# Patient Record
Sex: Male | Born: 1935 | Race: White | Hispanic: No | Marital: Married
Health system: Southern US, Community
[De-identification: ages and names within clinical notes are randomized; demographics above are authoritative.]

---

## 2005-03-12 ENCOUNTER — Encounter: Payer: Self-pay | Admitting: Rheumatology

## 2006-08-03 ENCOUNTER — Emergency Department: Payer: Self-pay | Admitting: Unknown Physician Specialty

## 2006-08-03 ENCOUNTER — Other Ambulatory Visit: Payer: Self-pay

## 2007-06-24 ENCOUNTER — Other Ambulatory Visit: Payer: Self-pay

## 2007-06-24 ENCOUNTER — Inpatient Hospital Stay: Payer: Self-pay | Admitting: Cardiology

## 2010-07-18 ENCOUNTER — Ambulatory Visit: Payer: Self-pay | Admitting: General Practice

## 2010-08-17 ENCOUNTER — Observation Stay: Payer: Self-pay | Admitting: Internal Medicine

## 2010-09-05 ENCOUNTER — Inpatient Hospital Stay (HOSPITAL_COMMUNITY)
Admission: RE | Admit: 2010-09-05 | Discharge: 2010-09-11 | Payer: Self-pay | Source: Home / Self Care | Admitting: Neurosurgery

## 2010-12-18 LAB — CBC
HCT: 23.8 % — ABNORMAL LOW (ref 39.0–52.0)
HCT: 33.9 % — ABNORMAL LOW (ref 39.0–52.0)
HCT: 34.3 % — ABNORMAL LOW (ref 39.0–52.0)
Hemoglobin: 11.2 g/dL — ABNORMAL LOW (ref 13.0–17.0)
Hemoglobin: 7.5 g/dL — ABNORMAL LOW (ref 13.0–17.0)
MCH: 23.6 pg — ABNORMAL LOW (ref 26.0–34.0)
MCH: 25.2 pg — ABNORMAL LOW (ref 26.0–34.0)
MCHC: 31.5 g/dL (ref 30.0–36.0)
MCHC: 33 g/dL (ref 30.0–36.0)
MCV: 74.8 fL — ABNORMAL LOW (ref 78.0–100.0)
MCV: 76.4 fL — ABNORMAL LOW (ref 78.0–100.0)
MCV: 77.1 fL — ABNORMAL LOW (ref 78.0–100.0)
Platelets: 323 K/uL (ref 150–400)
Platelets: 368 K/uL (ref 150–400)
RBC: 3.18 MIL/uL — ABNORMAL LOW (ref 4.22–5.81)
RBC: 4.44 MIL/uL (ref 4.22–5.81)
RBC: 4.45 MIL/uL (ref 4.22–5.81)
RDW: 15.9 % — ABNORMAL HIGH (ref 11.5–15.5)
RDW: 16.2 % — ABNORMAL HIGH (ref 11.5–15.5)
WBC: 7.2 K/uL (ref 4.0–10.5)
WBC: 7.4 K/uL (ref 4.0–10.5)
WBC: 9.2 10*3/uL (ref 4.0–10.5)

## 2010-12-18 LAB — BASIC METABOLIC PANEL
Calcium: 8.8 mg/dL (ref 8.4–10.5)
GFR calc Af Amer: >60 mL/min (ref >60)
GFR calc non Af Amer: >60 mL/min (ref >60)
Glucose, Bld: 98 mg/dL (ref 70–99)
Potassium: 3.4 mEq/L — ABNORMAL LOW (ref 3.5–5.1)
Sodium: 133 mEq/L — ABNORMAL LOW (ref 135–145)

## 2010-12-18 LAB — BASIC METABOLIC PANEL WITH GFR
BUN: 13 mg/dL (ref 6–23)
BUN: 15 mg/dL (ref 6–23)
CO2: 22 meq/L (ref 19–32)
CO2: 24 meq/L (ref 19–32)
Calcium: 8 mg/dL — ABNORMAL LOW (ref 8.4–10.5)
Calcium: 9.5 mg/dL (ref 8.4–10.5)
Chloride: 99 meq/L (ref 96–112)
Chloride: 107 meq/L (ref 96–112)
Creatinine, Ser: 0.82 mg/dL (ref 0.4–1.5)
Creatinine, Ser: 0.98 mg/dL (ref 0.4–1.5)
GFR calc Af Amer: >60 mL/min (ref >60)
GFR calc Af Amer: >60 mL/min (ref >60)
GFR calc non Af Amer: >60 mL/min (ref >60)
GFR calc non Af Amer: >60 mL/min (ref >60)
Glucose, Bld: 107 mg/dL — ABNORMAL HIGH (ref 70–99)
Glucose, Bld: 106 mg/dL — ABNORMAL HIGH (ref 70–99)
Potassium: 3.6 meq/L (ref 3.5–5.1)
Potassium: 4.5 meq/L (ref 3.5–5.1)
Sodium: 127 meq/L — ABNORMAL LOW (ref 135–145)
Sodium: 137 meq/L (ref 135–145)

## 2010-12-18 LAB — TYPE AND SCREEN
ABO/RH(D): A POS
ABO/RH(D): A POS
Antibody Screen: NEGATIVE
Antibody Screen: NEGATIVE
Unit division: 0
Unit division: 0

## 2010-12-18 LAB — DIFFERENTIAL
Basophils Absolute: 0 10*3/uL (ref 0.0–0.1)
Basophils Relative: 0 % (ref 0–1)
Eosinophils Absolute: 0.1 10*3/uL (ref 0.0–0.7)
Eosinophils Relative: 1 % (ref 0–5)
Lymphocytes Relative: 13 % (ref 12–46)
Lymphs Abs: 0.9 10*3/uL (ref 0.7–4.0)
Monocytes Absolute: 1 10*3/uL (ref 0.1–1.0)
Monocytes Relative: 13 % — ABNORMAL HIGH (ref 3–12)
Neutro Abs: 5.2 10*3/uL (ref 1.7–7.7)
Neutrophils Relative %: 72 % (ref 43–77)

## 2010-12-18 LAB — PREPARE RBC (CROSSMATCH)

## 2010-12-18 LAB — SURGICAL PCR SCREEN
MRSA, PCR: NEGATIVE
Staphylococcus aureus: NEGATIVE

## 2010-12-18 LAB — ABO/RH: ABO/RH(D): A POS

## 2011-12-11 IMAGING — CR DG CHEST 2V
2 series · 2 of 2 positions shown · non-contrast
Comparison: None.

CLINICAL DATA: Preop

CHEST - 2 VIEW

[view not recorded (1 of 2)]
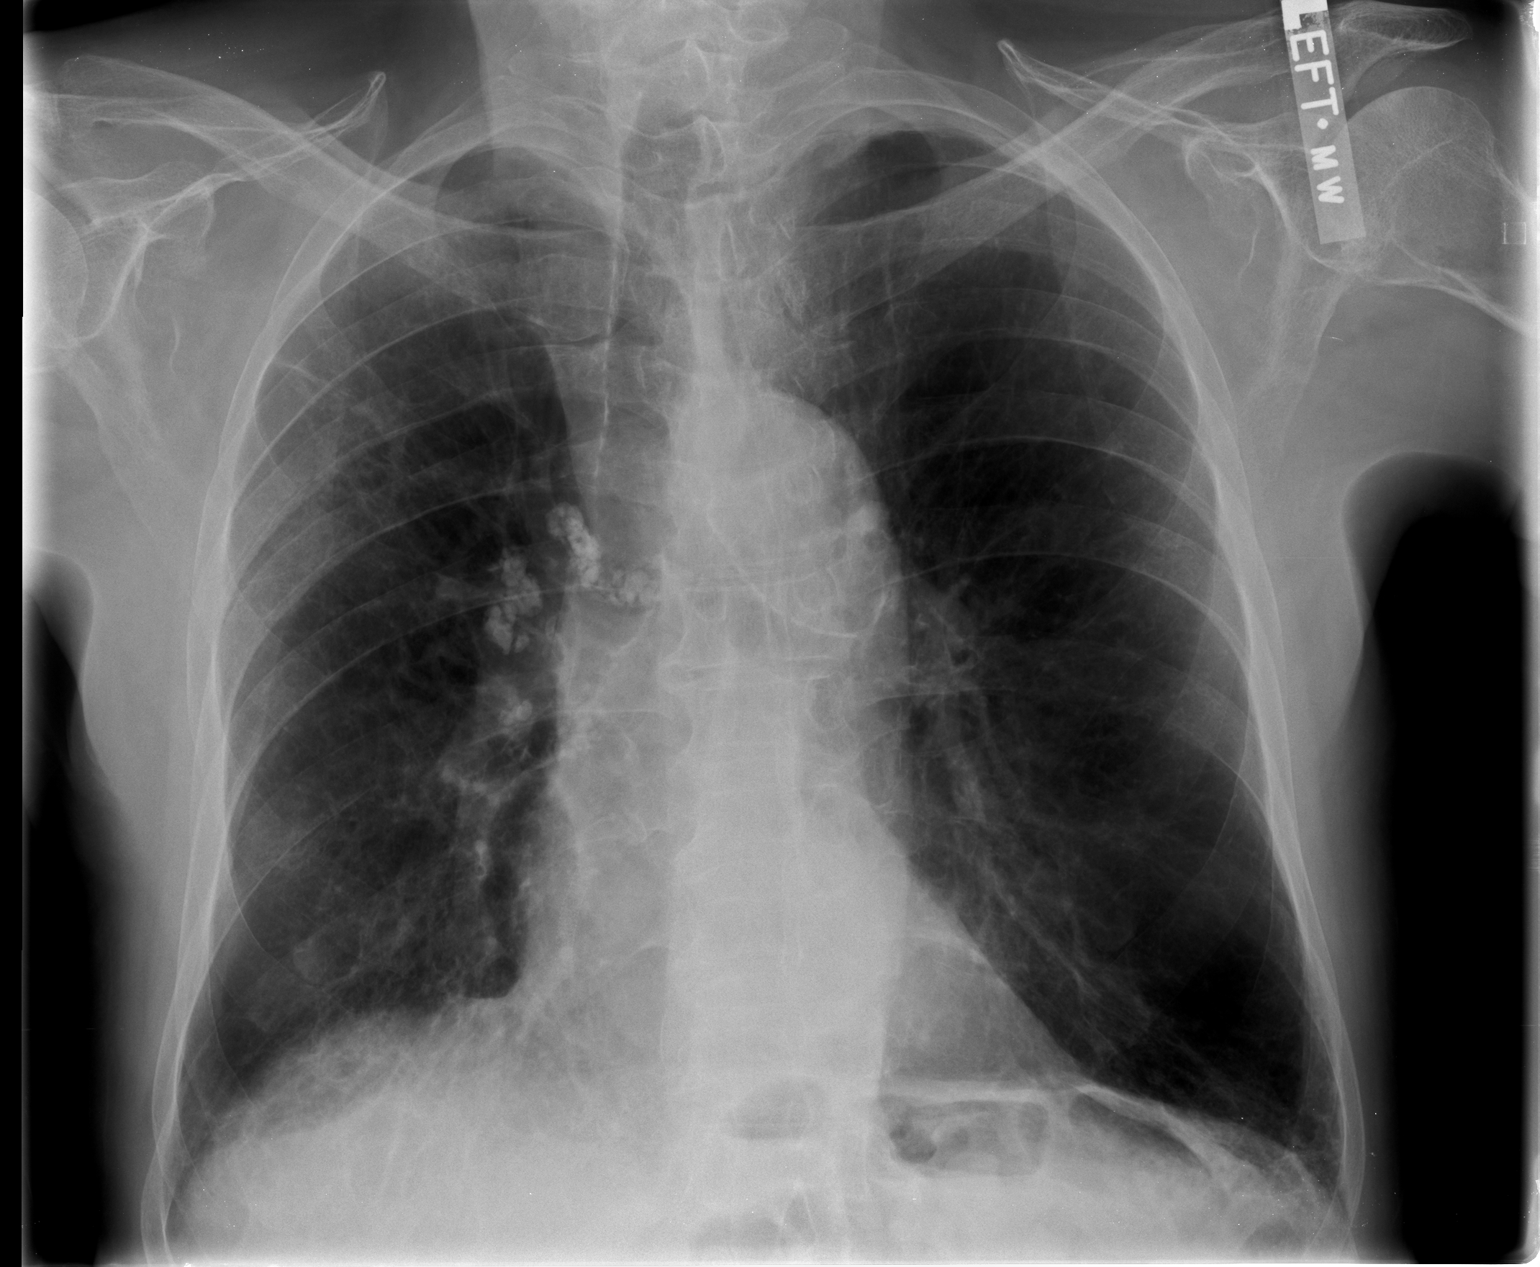

[view not recorded (2 of 2)]
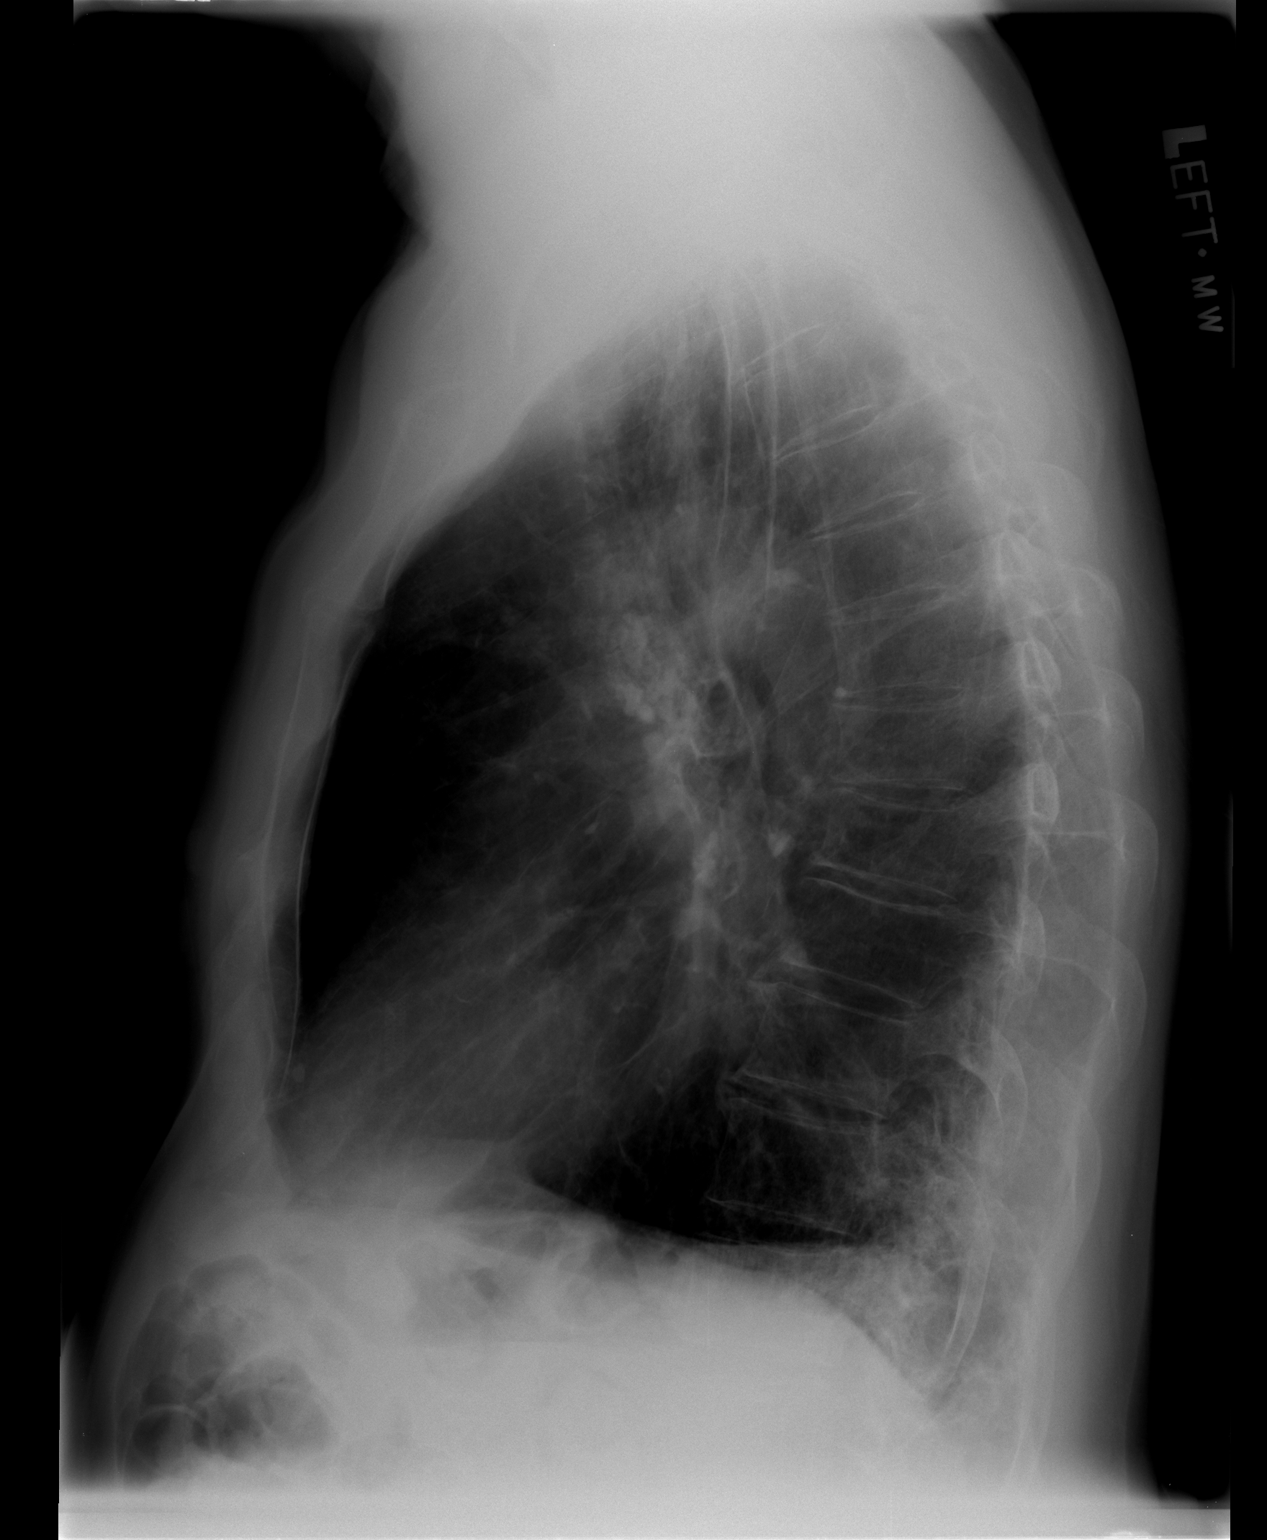

[2 of 2 positions shown; findings below may reference images not displayed]

FINDINGS: Normal heart size.  Right hilar calcifications are
associated with old granulomatous disease.  Suspected right upper
lobe cavitary lung lesion.  Left lung is clear.  1 cm nodular
density at right lung base.  Normal pulmonary vascularity.
Hyperaeration.
IMPRESSION: Possible right upper  lobe cavitary lung lesion.

1 cm nodular density right lung base.

CT is recommended to further evaluate.

## 2011-12-18 IMAGING — CR DG LUMBAR SPINE 2-3V
1 series · 1 of 1 positions shown · non-contrast
Comparison: None.

CLINICAL DATA: Lumbar stenosis.  L4-5 PLIF.

LUMBAR SPINE - 2-3 VIEW

[view not recorded]
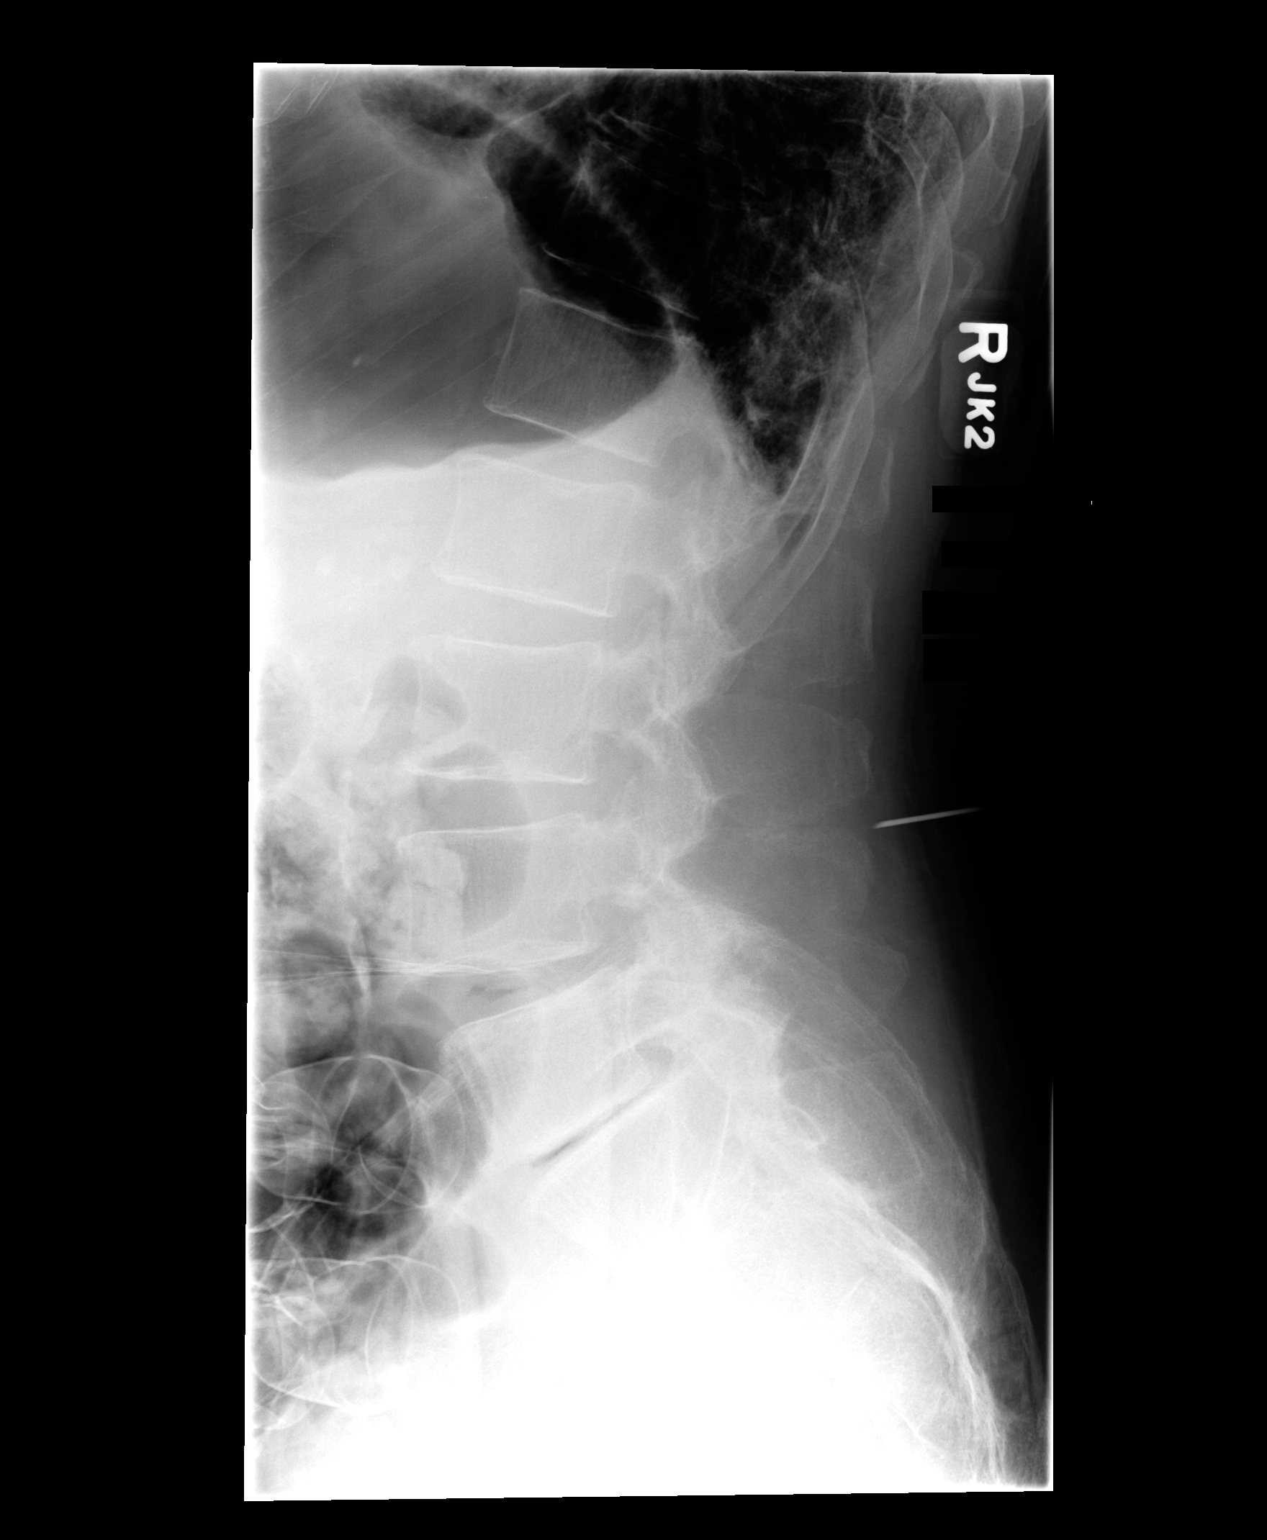

[1 of 1 positions shown; findings below may reference images not displayed]

FINDINGS: Image #1 is labeled [DATE] p.m.  An intraoperative
lateral view of the lumbar spine demonstrates a needle directed at
the L4 vertebral body.  Slight anterolisthesis is present at L4-5.
There is significant loss of disc height with a vacuum phenomenon
at L5-S1.  Atherosclerotic changes are noted.

The second image is labeled 4 o'clock p.m.  A surgical probe is
directed at the L3-4 disc space.
IMPRESSION: Intraoperative localization of the L3-4 disc space.

## 2011-12-18 IMAGING — RF DG LUMBAR SPINE 2-3V
1 series · 2 of 2 positions shown · non-contrast
Comparison: Intraoperative radiographs of the same day.

CLINICAL DATA: Lumbar stenosis.  Radiculopathy.  L4-5 PLIF.

LUMBAR SPINE - 2-3 VIEW

[Series 1: run · 2 of 2 slices shown]
[im 1/2]
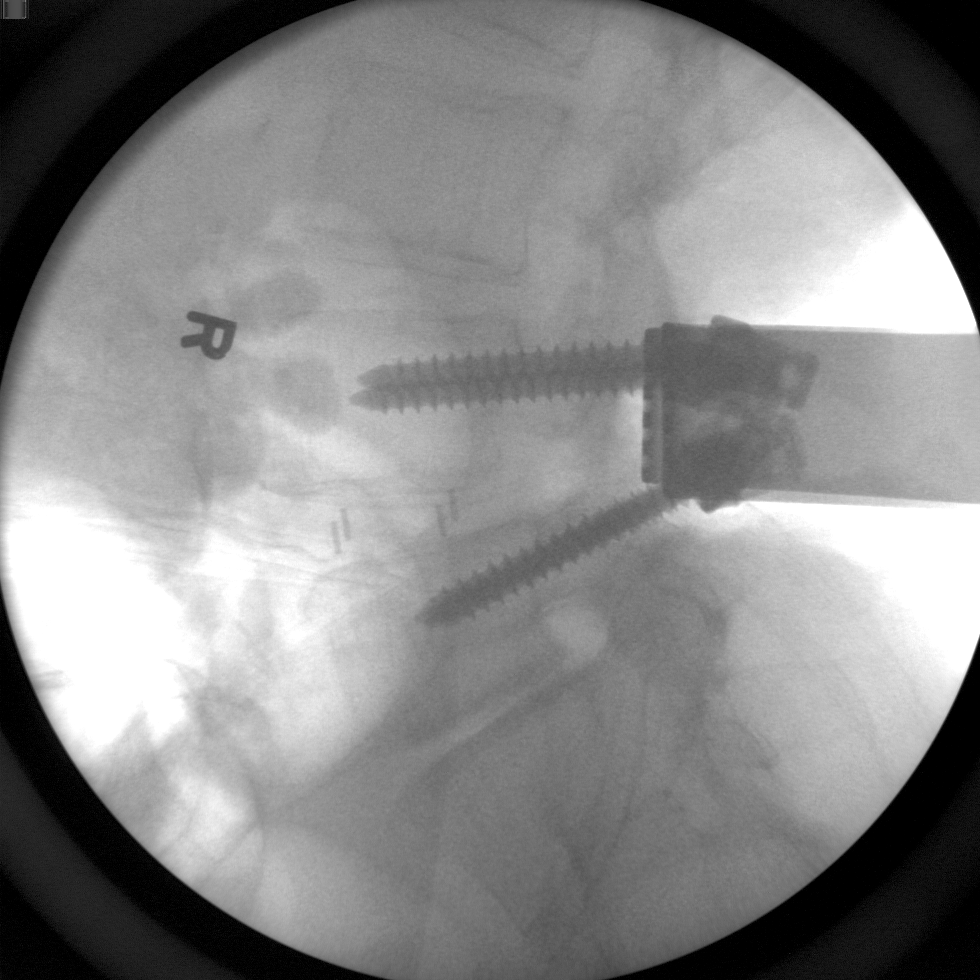
[im 2/2]
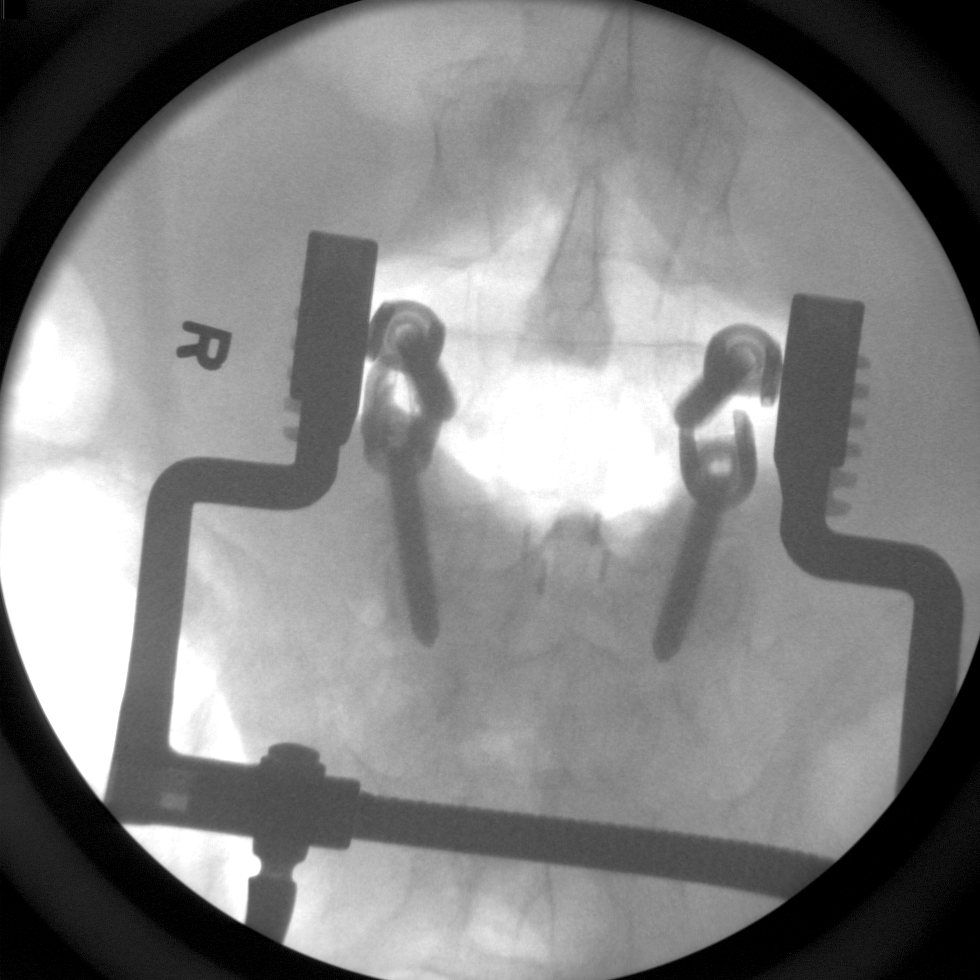

[2 of 2 positions shown; findings below may reference images not displayed]

FINDINGS: Bilateral pedicle screws are in place at L4 and L5.  Disc
spacers are noted.  Slight anterolisthesis at L4-5 is maintained.
IMPRESSION: 1.  Status post discectomy and placement of disc spacers as above.
2.  Interval placement of pedicle screws bilaterally at L4 and L5.
3.  No radiographic evidence for complication.

## 2012-04-30 ENCOUNTER — Emergency Department: Payer: Self-pay | Admitting: *Deleted

## 2012-04-30 LAB — BASIC METABOLIC PANEL
Calcium, Total: 8.8 mg/dL (ref 8.5–10.1)
EGFR (Non-African Amer.): 42 — ABNORMAL LOW
Glucose: 100 mg/dL — ABNORMAL HIGH (ref 65–99)
Osmolality: 285 (ref 275–301)
Potassium: 4.4 mmol/L (ref 3.5–5.1)

## 2012-04-30 LAB — CBC WITH DIFFERENTIAL/PLATELET
Basophil #: 0.1 10*3/uL (ref 0.0–0.1)
Eosinophil #: 0.3 10*3/uL (ref 0.0–0.7)
Lymphocyte %: 17.4 %
MCH: 30.5 pg (ref 26.0–34.0)
MCV: 89 fL (ref 80–100)
Monocyte %: 7.1 %
Platelet: 262 10*3/uL (ref 150–440)
RBC: 4.65 10*6/uL (ref 4.40–5.90)
RDW: 14.7 % — ABNORMAL HIGH (ref 11.5–14.5)

## 2012-09-12 ENCOUNTER — Observation Stay: Payer: Self-pay | Admitting: Internal Medicine

## 2012-09-12 LAB — HEPATIC FUNCTION PANEL A (ARMC)
Albumin: 3.4 g/dL (ref 3.4–5.0)
Bilirubin, Direct: <0.1 mg/dL (ref 0.00–0.20)
SGOT(AST): 32 U/L (ref 15–37)
Total Protein: 7.3 g/dL (ref 6.4–8.2)

## 2012-09-12 LAB — URINALYSIS, COMPLETE
Bacteria: NONE SEEN
Glucose,UR: NEGATIVE mg/dL (ref 0–75)
Ketone: NEGATIVE
Leukocyte Esterase: NEGATIVE
Ph: 5 (ref 4.5–8.0)
RBC,UR: 2 /HPF (ref 0–5)
Squamous Epithelial: <1
WBC UR: 1 /HPF (ref 0–5)

## 2012-09-12 LAB — CBC
MCH: 30.1 pg (ref 26.0–34.0)
MCHC: 34.2 g/dL (ref 32.0–36.0)
MCV: 88 fL (ref 80–100)
RBC: 4.73 10*6/uL (ref 4.40–5.90)
WBC: 11.1 10*3/uL — ABNORMAL HIGH (ref 3.8–10.6)

## 2012-09-12 LAB — MAGNESIUM: Magnesium: 1.8 mg/dL

## 2012-09-12 LAB — BASIC METABOLIC PANEL
Anion Gap: 10 (ref 7–16)
BUN: 19 mg/dL — ABNORMAL HIGH (ref 7–18)
Calcium, Total: 9.5 mg/dL (ref 8.5–10.1)
EGFR (Non-African Amer.): 51 — ABNORMAL LOW
Glucose: 101 mg/dL — ABNORMAL HIGH (ref 65–99)
Osmolality: 282 (ref 275–301)

## 2012-09-13 LAB — CBC WITH DIFFERENTIAL/PLATELET
HCT: 39.4 % — ABNORMAL LOW (ref 40.0–52.0)
Lymphocyte #: 1.2 10*3/uL (ref 1.0–3.6)
Lymphocyte %: 14.2 %
MCHC: 33.4 g/dL (ref 32.0–36.0)
Monocyte #: 0.7 x10 3/mm (ref 0.2–1.0)
Monocyte %: 9 %
Neutrophil %: 75.4 %
Platelet: 339 10*3/uL (ref 150–440)
RDW: 13.8 % (ref 11.5–14.5)
WBC: 8.1 10*3/uL (ref 3.8–10.6)

## 2012-09-13 LAB — BASIC METABOLIC PANEL
BUN: 13 mg/dL (ref 7–18)
Chloride: 111 mmol/L — ABNORMAL HIGH (ref 98–107)
Creatinine: 1.06 mg/dL (ref 0.60–1.30)
Potassium: 3.8 mmol/L (ref 3.5–5.1)
Sodium: 142 mmol/L (ref 136–145)

## 2012-10-09 ENCOUNTER — Ambulatory Visit: Payer: Self-pay | Admitting: Neurology

## 2013-08-13 IMAGING — CT CT CHEST W/O CM
1 series · 15 of 32 positions shown, 19 images · non-contrast
Comparison: none

REASON FOR EXAM: chest trauma
COMMENTS:

[Series 2: soft tissue · axial · 0.75mm/px · z∈[-368,-62]mm · 15 of 116 slices shown, 19 images]
[im 9/116  mediastinal]
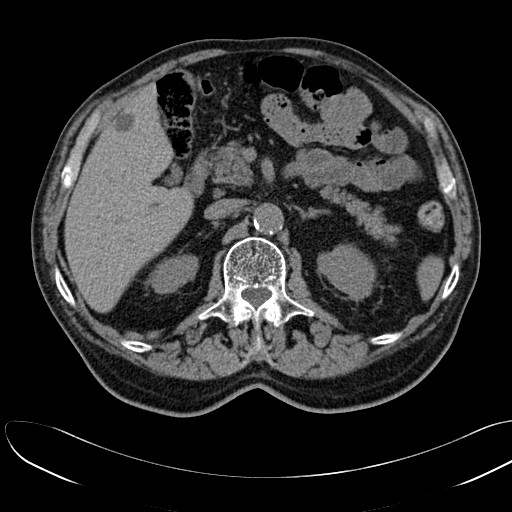
[im 9/116  lung]
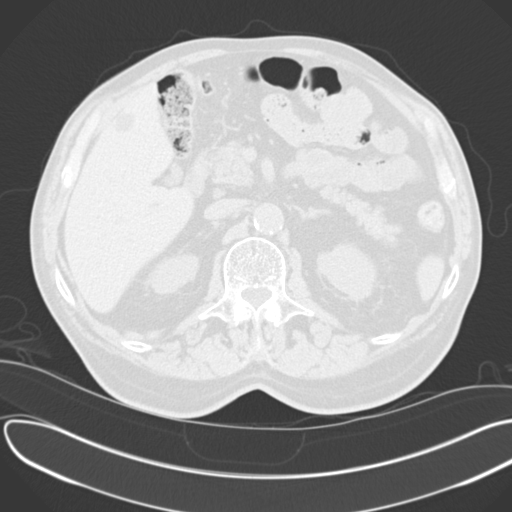
[im 18/116  lung]
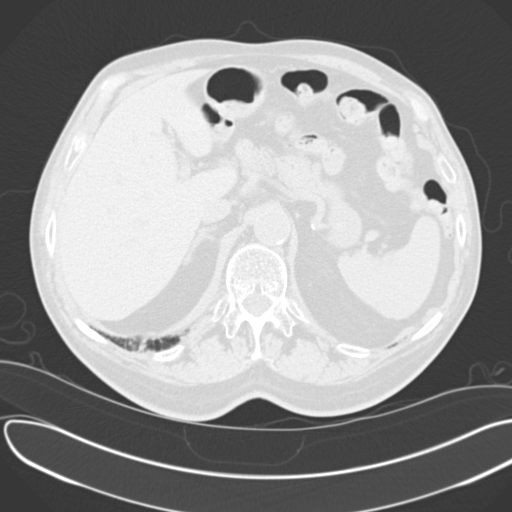
[im 24/116  lung]
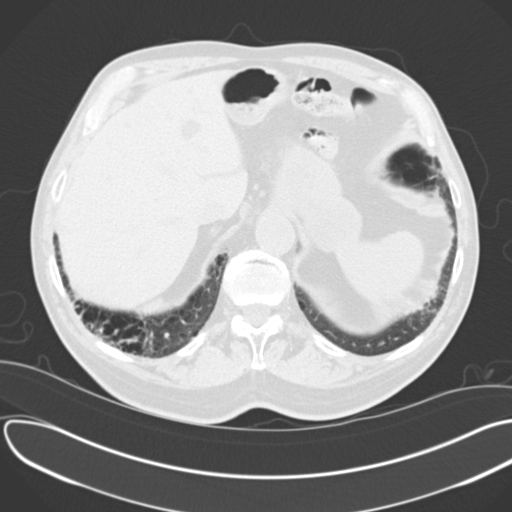
[im 30/116  lung]
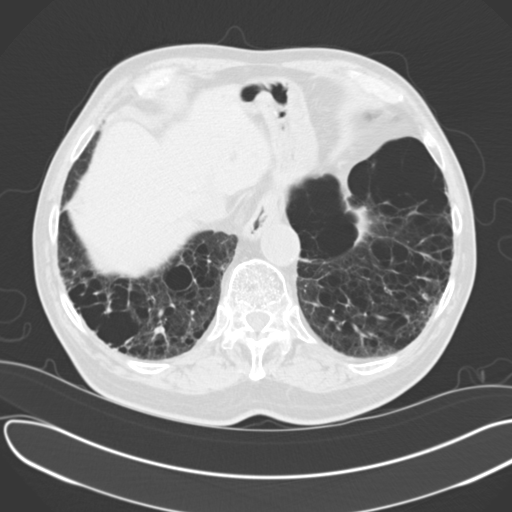
[im 39/116  mediastinal]
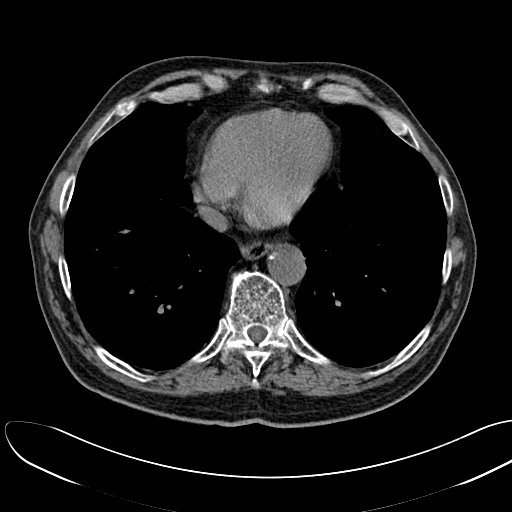
[im 39/116  lung]
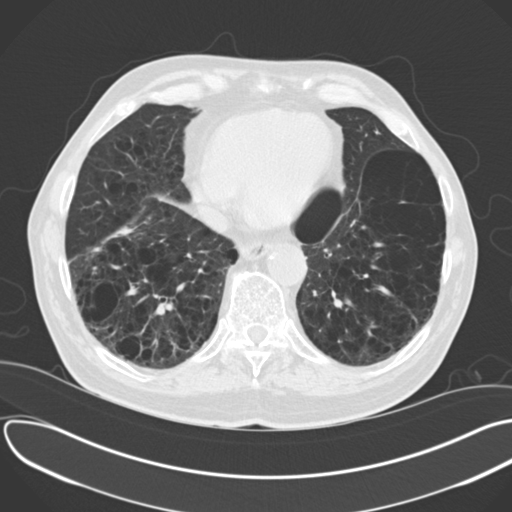
[im 47/116  lung]
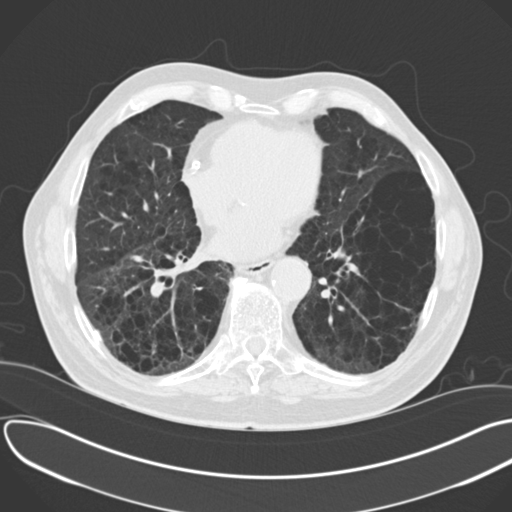
[im 56/116  lung]
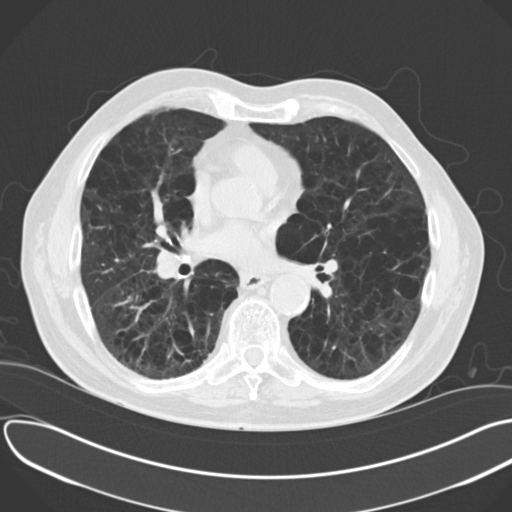
[im 61/116  lung]
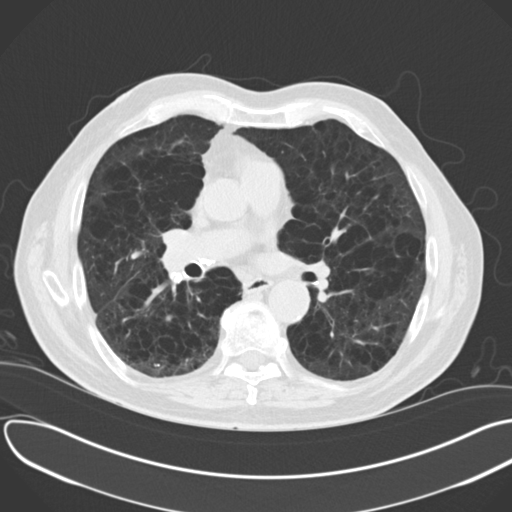
[im 69/116  mediastinal]
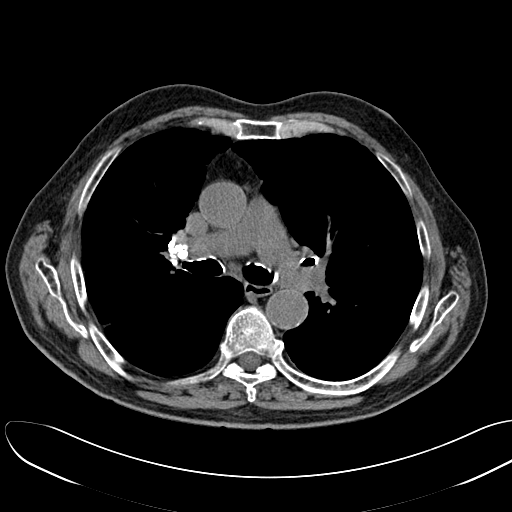
[im 69/116  lung]
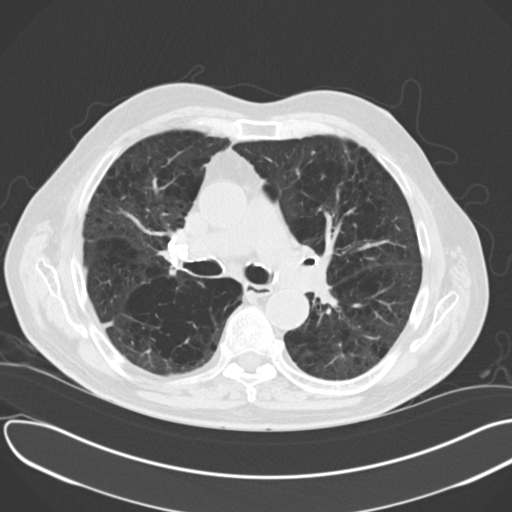
[im 73/116  lung]
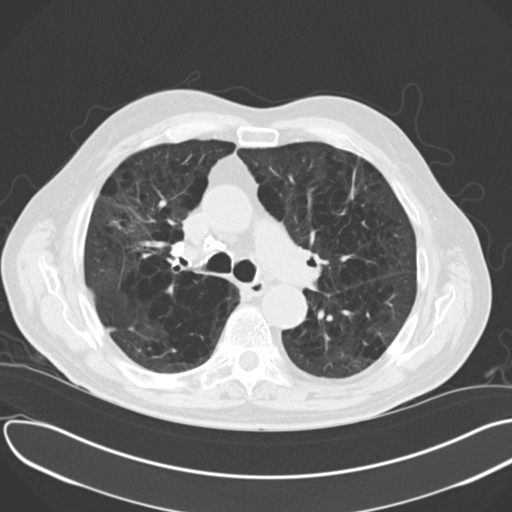
[im 81/116  lung]
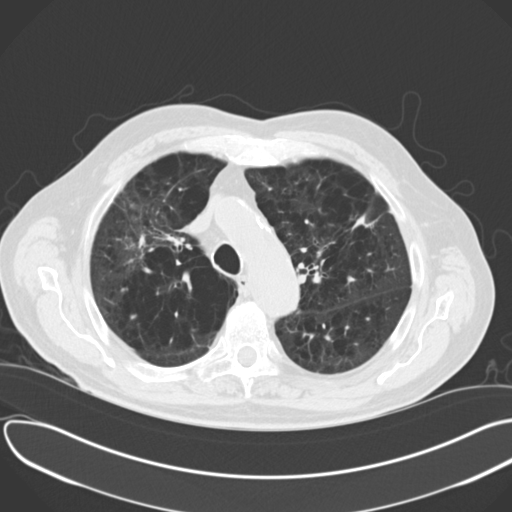
[im 90/116  lung]
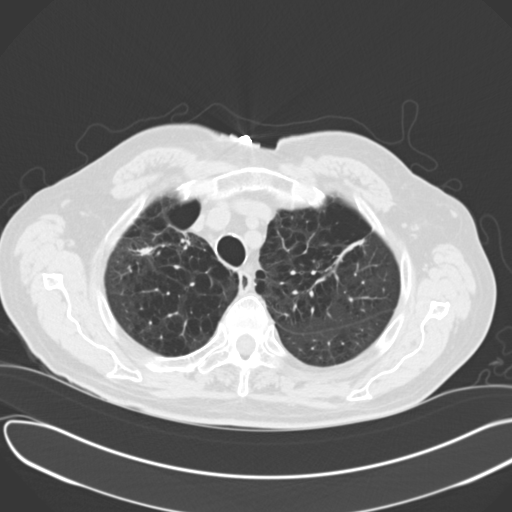
[im 94/116  mediastinal]
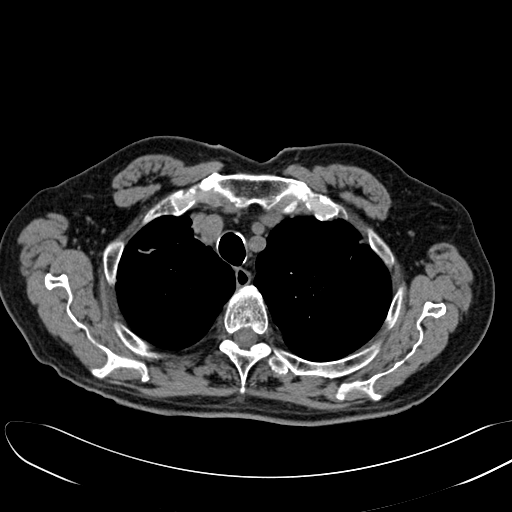
[im 94/116  lung]
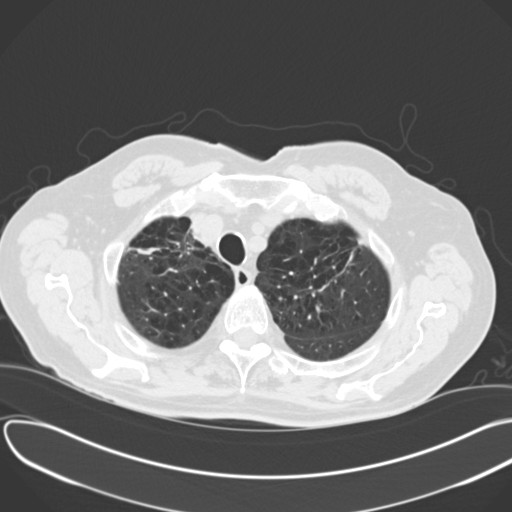
[im 103/116  lung]
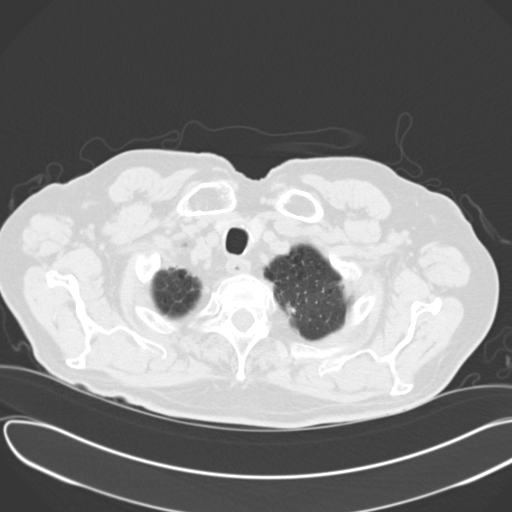
[im 111/116  lung]
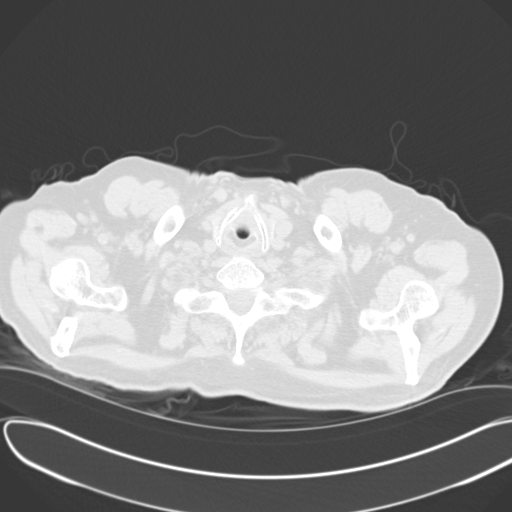

[15 of 32 positions shown; findings below may reference images not displayed]

PROCEDURE:     CT  - CT CHEST WITHOUT CONTRAST  - May 01, 2012 [DATE]

RESULT:     Axial CT scanning was performed through the chest without
administration of IV contrast due to renal issues. Review of multiplanar
reconstructed images was performed separately on the VIA monitor. Comparison
is made to CT scan of the abdomen and pelvis dated August 03, 2006. There
are no previous chest CT scans for comparison.

There are emphysematous changes diffusely within both lungs with large
bullous lesions. There is no evidence of a pneumothorax or
pneumomediastinum. There is no evidence of a pulmonary contusion. There is
no pleural effusion. The cardiac chambers are normal in size. The caliber of
the thoracic aorta is normal. The thoracic esophagus exhibits normal
caliber. No mediastinal or hilar lymphadenopathy is demonstrated. There is
no evidence of a mediastinal hematoma.

 Within the upper abdomen there are stable hypodensities within the liver
most compatible with cysts. The spleen is not enlarged. There is chronically
increased density in the perinephric fat on the left associated with the
visualized portions of the kidney. In the visualized portions of the left
kidney cystic changes are seen similar to those seen on a study 03 August, 2006 at which time cortical and parapelvic cysts were demonstrated.
At bone window settings the clavicles and sternum and thoracic vertebral
bodies exhibit no acute abnormality. The observed portions of the ribs also
exhibit no acute abnormality.
IMPRESSION: 1. There is no evidence of acute intrathoracic injury.
2. There is severe emphysema with bullous lesions and fibrotic change.
3. The appearance of the cardiac silhouette and thoracic aorta is normal.
4. There are stable cysts within the liver. Chronic cystic changes in the
visualized portions of the left kidney are demonstrated. No acute injury of
the visualized upper abdominal viscera is seen.

A preliminary report was sent to the [HOSPITAL] the conclusion
of the study.

## 2015-01-24 NOTE — H&P (Signed)
PATIENT NAME:  Gabriel Walker, Gabriel Walker MR#:  [id number] DATE OF BIRTH:  11/03/1922  DATE OF ADMISSION:  09/12/2012  PRIMARY CARE PHYSICIAN: Dr. Burnett Sheng   CHIEF COMPLAINT: Bilateral lower extremity pain and inability to walk.   HISTORY OF PRESENTING ILLNESS: 81 year old male patient with history of prostate cancer, on and off dizziness, coronary artery disease presents to the Emergency Room complaining of bilateral calf pain and inability to walk. Patient did have a fall yesterday. He called the ambulance and was brought to the ER. Here in the Emergency Room patient's vitals have been stable but patient needs two-person assist to get up and walk. His ck is 11. Chest x-ray shows possible right lower lobe infiltrate and is being admitted to the hospitalist service for treatment of his pneumonia, severe weakness and also physical therapy and possible placement in a skilled nursing facility.   Patient complains of some cough, weakness and shortness of breath over the last one week and dizziness. He also has left-sided tooth pain for which he is on antibiotic as per the primary care physician; the antibiotic name is unknown at this time.   PAST MEDICAL HISTORY:  1. Prostate cancer. 2. Chronic obstructive pulmonary disease. 3. Coronary artery disease. 4. Hyperlipidemia. 5. Lumbar compression.   PAST SURGICAL HISTORY:  1. Corneal implants. 2. Appendectomy.   ALLERGIES: No known drug allergies.   CURRENT MEDICATIONS:  1. Antivert 12.5 mg oral 3 times a day as needed for vertigo.  2. Aspirin 81 mg oral daily.  3. Percocet 5/325, 1 tablet orally every four hours as needed for pain.  4. TUMS twice daily as needed.  5. Iron pills once daily.   SOCIAL HISTORY: Patient does not smoke. No alcohol. No illicit drugs. Lives alone. Walks with a cane.   FAMILY HISTORY: Denies any coronary artery disease or hypertension in the family.    REVIEW OF SYSTEMS: CONSTITUTIONAL: No weight loss, weight gain. Has  fatigue and weakness. EYES: No blurred vision, pain, redness. ENT: No hearing loss, tinnitus. Does have on and off vertigo. CARDIOVASCULAR: No chest pain, PND, orthopnea. PULMONARY: Has some shortness of breath and cough, yellow sputum. GASTROINTESTINAL: No nausea, vomiting, diarrhea, abdominal pain. GENITOURINARY: No dysuria, polyuria. ENDOCRINE: No heat or cold intolerance. SKIN: No rash, ulcers. MUSCULOSKELETAL: Has arthritis and back pain. NEUROLOGIC: Has weakness in both his lower extremities. No numbness.   PHYSICAL EXAMINATION:  VITAL SIGNS: Temperature 96.3, pulse 75, respirations 16, blood pressure 156/80, saturating 97% on room air.   GENERAL: Moderately built Caucasian male patient laying in bed, comfortable, conversational, cooperative with examination.   PSYCHIATRIC: Alert, oriented x3. Mood and affect appropriate. Judgment intact.   HEENT: Atraumatic, normocephalic. Oral mucosa moist and pink. External ears and nose normal. No pallor. No icterus. Pupils bilaterally equal and reactive to light.   NECK: Supple. No thyromegaly. No palpable lymph nodes. Trachea midline. No carotid bruit, JVD.   CARDIOVASCULAR: S1, S2, regular rate and rhythm. No edema. Peripheral pulses 2+.   RESPIRATORY: Normal work of breathing. Clear to auscultation on both sides.   GASTROINTESTINAL: Soft abdomen, nontender. Bowel sounds present. No hepatosplenomegaly palpable.   SKIN: Warm and dry. No petechiae, rash, ulcers.   MUSCULOSKELETAL: No joint swelling, redness, effusion of the large joints. Normal muscle tone.   NEUROLOGICAL: Motor strength 5/5 in upper extremities and 4+ in bilateral lower extremities. Sensation to fine touch intact all over. Cranial nerves II through XII intact.   LABORATORY, DIAGNOSTIC AND RADIOLOGIC DATA: Glucose 101, BUN 19,  creatinine 1.25, sodium 140, potassium 3.9, bicarbonate 19, GFR 51. AST, ALT, alkaline phosphatase, bilirubin normal. CK 89. WBC 11.1, hemoglobin 14.3,  platelets 411. Urinalysis shows no bacteria or WBC.   Venous Doppler of bilateral lower extremity shows no deep vein thrombosis.   Chest x-ray shows possible right lower lobe infiltrate.   ASSESSMENT AND PLAN:  1. Right lower lobe pneumonia. Start patient on IV ceftriaxone and azithromycin. Will start him also on IV fluids. Blood cultures and sputum cultures have been sent from the ER which will be followed up. Nebulizer treatment as needed.  2. Weakness and fatigue.  This is likely secondary from the pneumonia patient has. Will ask physical therapy to see. Also contributing factor might be dehydration. Start on IV fluids.  3. Chronic kidney disease, stage III.  4. Coronary artery disease, stable.  5. Deep vein thrombosis prophylaxis with Lovenox.  6. CODE STATUS: FULL CODE.   TIME SPENT: Time spent today on this case was greater than 75 minutes with more than 50% time spent in coordination of care.   ____________________________ Molinda BailiffSrikar R. Terrill Wauters, MD srs:cms D: 09/12/2012 12:11:57 ET T: 09/12/2012 12:37:03 ET JOB#: [id number] cc: Wardell HeathSrikar R. Keerthi Hazell, MD, <Dictator> Rhona LeavensJames F. Burnett ShengHedrick, MD Orie FishermanSRIKAR R Harace Mccluney MD ELECTRONICALLY SIGNED 09/18/2012 11:08

## 2015-01-24 NOTE — Discharge Summary (Signed)
PATIENT NAME:  Gabriel Walker, Gabriel Walker MR#:  [id number] DATE OF BIRTH:  [birth year]  DATE OF ADMISSION:  09/12/2012 DATE OF DISCHARGE:  09/13/2012  PRIMARY CARE PHYSICIAN: Dr. Burnett ShengHedrick.  DISCHARGE DIAGNOSES:  1. Myalgias. 2. Right lower lobe pneumonitis.  3. Dehydration.  4. Chronic kidney disease, stage III.   IMAGING STUDIES: Chest x-ray showed right lower lobe early infiltrate. Venous Doppler's of the lower extremities showed no deep vein thrombosis.   CONSULTANTS: None.   ADMITTING HISTORY AND PHYSICAL: Please see detailed history and physical dictated on 09/12/2012. In brief, this is an 81 year old male patient with history of prostate cancer and coronary artery disease who presented to the Emergency Room complaining of bilateral calf pains and inability to walk and extreme weakness. The patient was seen in the Emergency Room, was found to have right lower lobe pneumonitis with two person assist which was new and admitted to the hospitalist service. The patient was found to be dehydrated.   HOSPITAL COURSE:  1. Right lower lobe pneumonia. The patient was started on IV antibiotics. Blood and sputum cultures sent which were negative. The patient was also hydrated with IV fluids. On the day of discharge, the patient was afebrile doing significantly better. He was seen by physical therapy and suggested physical therapy at home health which was set up and the patient was discharged home in fair condition with normal respiratory examination and vitals in a normal range. The patient was saturating 98% on room air.  2. The patient's chronic kidney disease was checked, did not have any rhabdomyolysis, but his myalgias were thought to be secondary to the pneumonia.   DISCHARGE MEDICATIONS:  1. Tylenol 500 mg every six hours as needed for pain or fever.  2. TUMS 500 mg oral tablet twice a day as needed.  3. Furoxone 50 mg oral capsule once a day.  4. Celexa 10 mg oral once a day.  5. Levaquin 750 mg  oral once a day for six days.   DISCHARGE INSTRUCTIONS:  1. Regular diet.  2. Activity as tolerated using a cane.  3. The patient has been set up with physical therapy at home health.   TIME SPENT ON THE DAY OF DISCHARGE IN DISCHARGE ACTIVITY: 45 minutes.   ____________________________ Molinda BailiffSrikar R. Else Habermann, MD srs:ap D: 09/18/2012 10:58:40 ET T: 09/18/2012 12:19:19 ET JOB#: [id number]  cc: Wardell HeathSrikar R. Maryclaire Stoecker, MD, <Dictator> Rhona LeavensJames F. Burnett ShengHedrick, MD  Orie FishermanSRIKAR R Dereka Lueras MD ELECTRONICALLY SIGNED 09/20/2012 23:36

## 2018-09-06 DEATH — deceased
